# Patient Record
Sex: Female | Born: 2005 | Race: White | Hispanic: No | Marital: Single | Smoking: Former smoker
Health system: Southern US, Community
[De-identification: ages and names within clinical notes are randomized; demographics above are authoritative.]

---
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
## 2023-02-16 ENCOUNTER — Ambulatory Visit: Payer: 59 | Admitting: Gastroenterology

## 2023-02-16 ENCOUNTER — Encounter: Payer: Self-pay | Admitting: Gastroenterology

## 2023-02-16 VITALS — BP 98/64 | HR 90 | Ht 63.0 in | Wt 101.0 lb

## 2023-02-16 DIAGNOSIS — K5909 Other constipation: Secondary | ICD-10-CM

## 2023-02-16 DIAGNOSIS — R112 Nausea with vomiting, unspecified: Secondary | ICD-10-CM

## 2023-02-16 DIAGNOSIS — K219 Gastro-esophageal reflux disease without esophagitis: Secondary | ICD-10-CM | POA: Diagnosis not present

## 2023-02-16 DIAGNOSIS — K5900 Constipation, unspecified: Secondary | ICD-10-CM

## 2023-02-16 MED ORDER — OMEPRAZOLE 20 MG PO CPDR: 20.0000 mg | DELAYED_RELEASE_CAPSULE | Freq: Every day | ORAL | 3 refills | Status: AC

## 2023-02-16 NOTE — Patient Instructions (Addendum)
 _______________________________________________________  If your blood pressure at your visit was 140/90 or greater, please contact your primary care physician to follow up on this.  If you are age 20 or younger, your body mass index should be between 19-25. Your Body mass index is 17.89 kg/m. If this is out of the aformentioned range listed, please consider follow up with your Primary Care Provider.  ________________________________________________________  The [facility] providers would like to encourage you to use MYCHART to communicate with providers for non-urgent requests or questions.  Due to long hold times on the telephone, sending your provider a message by [facility] may be a faster and more efficient way to get a response.  Please allow 48 business hours for a response.  Please remember that this is for non-urgent requests.  _______________________________________________________  We have sent the following medications to your pharmacy for you to pick up at your convenience:  START: omeprazole  20mg  one capsule daily before breakfast  Please purchase the following medications over the counter and take as directed:  Start taking Miralax 1 capful (17 grams) 1x / day for 1 week.   If this is not effective, increase to 1 dose 2x / day for 1 week.   If this is still not effective, increase to two capfuls (34 grams) 2x / day.   Can adjust dose as needed based on response. Can take 1/2 cap daily, skip days, or increase per day.    Thank you for entrusting me with your care and choosing [facility].  [employer]

## 2023-02-16 NOTE — Progress Notes (Signed)
 Chief Complaint: Constipation, nausea, vomiting Primary GI MD: Sampson  HPI: Discussed the use of AI scribe software for clinical note transcription with the patient, who gave verbal consent to proceed.  History of Present Illness   Monica Weber is a 18 year old female with chronic constipation and GERD who presents with nausea and vomiting. She is accompanied by her mother.  For the past few months, she has experienced unpredictable episodes of nausea and vomiting, particularly severe after meals, leading to an inability to eat without feeling nauseous and often resulting in vomiting. Approximately two weeks ago, she had another significant episode. During these episodes, she feels an urge to have a bowel movement but is unable to do so, which is followed by nausea and vomiting.  She was unsure if her production of nausea and vomiting was due to her anxiety about having to vomit.  Burping sometimes accompanies these symptoms. No blood in vomit.  She has a history of chronic constipation, with bowel movements occurring three to four times a week during good weeks. She experiences lower abdominal cramping that improves after a bowel movement. Her mother confirms a lifelong tendency towards constipation. She recalls using Miralax as a child to manage her constipation.  She has been using Aciphex, initially prescribed for her mother's GERD, to manage her symptoms of nausea, vomiting, and burping. She reports improvement with this medication but is unsure if it was due to a placebo effect. She has since discontinued its use.  She acknowledges not drinking enough water, which may contribute to her symptoms. Her family history includes her mother having IBS-D and GERD. She also mentions that her husband experiences infrequent bowel movements, sometimes going eight days without one.   Patient is not a fan of blood draws and recently had blood via PCP at [facility] within the last  3 weeks.  She says need the results on her phone which show CBC and CMP within normal limits.   History reviewed. No pertinent past medical history.  History reviewed. No pertinent surgical history.  Current Outpatient Medications  Medication Sig Dispense Refill   HAILEY 24 FE 1-20 MG-MCG(24) tablet Take 1 tablet by mouth daily.     omeprazole  (PRILOSEC) 20 MG capsule Take 1 capsule (20 mg total) by mouth daily. 30 capsule 3   No current facility-administered medications for this visit.    Allergies as of 02/16/2023 - Review Complete 02/16/2023  Allergen Reaction Noted   Peanut (diagnostic)  02/16/2023    Family History  Problem Relation Age of Onset   Irritable bowel syndrome Mother    GER disease Mother    Epilepsy Sister    Autism Sister    Hypothyroidism Sister    Macular degeneration Maternal Grandmother    Diabetes Maternal Grandmother    Liver disease Maternal Grandmother    Vasculitis Maternal Grandmother    Hypertension Maternal Grandmother    Hyperlipidemia Maternal Grandmother    Colon cancer Neg Hx    Gastric cancer Neg Hx    Pancreatic cancer Neg Hx    Renal cancer Neg Hx     Social History   Socioeconomic History   Marital status: Single    Spouse name: Not on file   Number of children: Not on file   Years of education: Not on file   Highest education level: Not on file  Occupational History   Occupation: barista  Tobacco Use   Smoking status: Former    Types: E-cigarettes  Smokeless tobacco: Never  Vaping Use   Vaping status: Former  Substance and Sexual Activity   Alcohol use: Never   Drug use: Never   Sexual activity: Yes  Other Topics Concern   Not on file  Social History Narrative   Not on file   Social Drivers of Health   Financial Resource Strain: Not on file  Food Insecurity: Not on file  Transportation Needs: Not on file  Physical Activity: Not on file  Stress: Not on file  Social Connections: Not on file  Intimate Partner  Violence: Not on file    Review of Systems:    Constitutional: No weight loss, fever, chills, weakness or fatigue HEENT: Eyes: No change in vision               Ears, Nose, Throat:  No change in hearing or congestion Skin: No rash or itching Cardiovascular: No chest pain, chest pressure or palpitations   Respiratory: No SOB or cough Gastrointestinal: See HPI and otherwise negative Genitourinary: No dysuria or change in urinary frequency Neurological: No headache, dizziness or syncope Musculoskeletal: No new muscle or joint pain Hematologic: No bleeding or bruising Psychiatric: No history of depression or anxiety    Physical Exam:  Vital signs: BP 98/64   Pulse 90   Ht 5' 3 (1.6 m)   Wt 101 lb (45.8 kg)   LMP 02/08/2023   BMI 17.89 kg/m   Constitutional: NAD, Well developed, Well nourished, alert and cooperative Head:  Normocephalic and atraumatic. Eyes:   PEERL, EOMI. No icterus. Conjunctiva pink. Respiratory: Respirations even and unlabored. Lungs clear to auscultation bilaterally.   No wheezes, crackles, or rhonchi.  Cardiovascular:  Regular rate and rhythm. No peripheral edema, cyanosis or pallor.  Gastrointestinal:  Soft, nondistended, nontender. No rebound or guarding.  Hypoactive bowel sounds. No appreciable masses or hepatomegaly. Rectal:  Not performed.  Msk:  Symmetrical without gross deformities. Without edema, no deformity or joint abnormality.  Neurologic:  Alert and  oriented x4;  grossly normal neurologically.  Skin:   Dry and intact without significant lesions or rashes. Psychiatric: Oriented to person, place and time. Demonstrates good judgement and reason without abnormal affect or behaviors.   RELEVANT LABS AND IMAGING: CBC    Component Value Date/Time   WBC 5.5 06/23/2020 0759   RBC 4.38 06/23/2020 0759   HGB 13.3 06/23/2020 0759   HCT 41.0 06/23/2020 0759   PLT 187 06/23/2020 0759   MCV 93.6 06/23/2020 0759   MCH 30.4 06/23/2020 0759   MCHC  32.4 06/23/2020 0759   RDW 12.5 06/23/2020 0759   LYMPHSABS 1,788 06/23/2020 0759   EOSABS 451 06/23/2020 0759   BASOSABS 28 06/23/2020 0759    CMP  No results found for: NA, K, CL, CO2, GLUCOSE, BUN, CREATININE, CALCIUM, PROT, ALBUMIN, AST, ALT, ALKPHOS, BILITOT, GFRNONAA, GFRAA   Assessment/Plan:      Chronic Constipation Longstanding history of infrequent bowel movements (3-4 per week), associated with lower abdominal cramping that improves post-defecation.   --Start daily Miralax, with dose adjustment as needed for symptom control. --Increase water intake to at least 64 ounces per day.  Gastroesophageal Reflux Disease (GERD) Nausea/vomiting History of nausea, vomiting, and burping, which improved with Aciphex. Recent recurrence of symptoms.  Suspect her nausea and vomiting may be factorial and related to constipation versus dyspepsia versus anxiety.  Though there was improvement on Aciphex which is reassuring. -- Start Omeprazole  20mg  daily for 60 days. - Follow-up with me in 8 weeks -  Educated patient on lifestyle modifications and provided patient education handouts  Follow-up in 8 weeks to assess response to treatment. If symptoms persist, consider further diagnostic imaging and labs.      Nestor Blower, PA-C [facility] 02/16/2023, 2:30 PM  Cc: No ref. provider found

## 2023-02-17 NOTE — Progress Notes (Signed)
 I agree with the assessment and plan as outlined by Ms. McMichael.

## 2023-04-13 ENCOUNTER — Encounter: Payer: Self-pay | Admitting: Physician Assistant

## 2023-04-13 ENCOUNTER — Ambulatory Visit: Payer: 59 | Admitting: Physician Assistant

## 2023-04-13 VITALS — BP 98/64 | HR 84 | Ht 63.0 in | Wt 100.8 lb

## 2023-04-13 DIAGNOSIS — R112 Nausea with vomiting, unspecified: Secondary | ICD-10-CM | POA: Diagnosis not present

## 2023-04-13 DIAGNOSIS — K5909 Other constipation: Secondary | ICD-10-CM

## 2023-04-13 DIAGNOSIS — N920 Excessive and frequent menstruation with regular cycle: Secondary | ICD-10-CM

## 2023-04-13 DIAGNOSIS — K5900 Constipation, unspecified: Secondary | ICD-10-CM

## 2023-04-13 DIAGNOSIS — K219 Gastro-esophageal reflux disease without esophagitis: Secondary | ICD-10-CM | POA: Diagnosis not present

## 2023-04-13 DIAGNOSIS — N946 Dysmenorrhea, unspecified: Secondary | ICD-10-CM | POA: Diagnosis not present

## 2023-04-13 NOTE — Patient Instructions (Addendum)
 Follow up as needed.  Please take your proton pump inhibitor medication, omeprazole for one more and then can do as needed or pepcid as needed Avoid spicy and acidic foods Avoid fatty foods Limit your intake of coffee, tea, alcohol, and carbonated drinks Work to maintain a healthy weight Keep the head of the bed elevated at least 3 inches with blocks or a wedge pillow if you are having any nighttime symptoms Stay upright for 2 hours after eating Avoid meals and snacks three to four hours before bedtime  Miralax is an osmotic laxative.  It only brings more water into the stool.  This is safe to take daily.  Can take up to 17 gram of miralax twice a day.  Mix with juice or coffee.  Start 1 capful at night for 3-4 days and reassess your response in 3-4 days.  You can increase and decrease the dose based on your response.  Remember, it can take up to 3-4 days to take effect OR for the effects to wear off.   I often pair this with benefiber in the morning to help assure the stool is not too loose.   Toileting tips to help with your constipation - Drink at least 64-80 ounces of water/liquid per day. - Establish a time to try to move your bowels every day.  For many people, this is after a cup of coffee or after a meal such as breakfast. - Sit all of the way back on the toilet keeping your back fairly straight and while sitting up, try to rest the tops of your forearms on your upper thighs.   - Raising your feet with a step stool/squatty potty can be helpful to improve the angle that allows your stool to pass through the rectum. - Relax the rectum feeling it bulge toward the toilet water.  If you feel your rectum raising toward your body, you are contracting rather than relaxing. - Breathe in and slowly exhale. "Belly breath" by expanding your belly towards your belly button. Keep belly expanded as you gently direct pressure down and back to the anus.  A low pitched GRRR sound can assist with  increasing intra-abdominal pressure.  (Can also trying to blow on a pinwheel and make it move, this helps with the same belly breathing) - Repeat 3-4 times. If unsuccessful, contract the pelvic floor to restore normal tone and get off the toilet.  Avoid excessive straining. - To reduce excessive wiping by teaching your anus to normally contract, place hands on outer aspect of knees and resist knee movement outward.  Hold 5-10 second then place hands just inside of knees and resist inward movement of knees.  Hold 5 seconds.  Repeat a few times each way.  Go to the ER if unable to pass gas, severe AB pain, unable to hold down food, any shortness of breath of chest pain.

## 2023-04-13 NOTE — Progress Notes (Signed)
 I agree with the assessment and plan as outlined by Ms. Steffanie Dunn.

## 2023-04-13 NOTE — Progress Notes (Signed)
 04/13/2023 Monica Weber [id number] 10/09/2005  Referring provider: No ref. provider found Primary GI doctor: Dr. Leonides Schanz  ASSESSMENT AND PLAN:   Chronic constipation Started on MiraLAX 02/16/2023 and increasing water Tried every day for a week but did not help has been doing castor oil packing and has had BM once or twice a day, soft to hard No AB pain, no hematochezia, no weight loss Will call if any changes, given information Consider linzess  GERD with nausea and vomiting Trial of omeprazole 20 mg daily for 2 months Has been better with medication trial, has had less nausea, no further vomiting No dysphagia, no melena Ibuprofen PRN, no ETOH, no family history of GB  no abdominal ultrasound Normal labs with PCP, CBC and CMET  Menorrhagia/dysmenorrhea Worsening in the last several months Going to follow up with GYN   Patient Care Team: Alysia Penna, MD as PCP - General (Internal Medicine) Imogene Burn, MD as Consulting Physician (Gastroenterology)  HISTORY OF PRESENT ILLNESS: 18 y.o. female with a past medical history of listed below presents for follow-up for constipation nausea and vomiting. Patient last seen in the office 02/16/2023 by PA McMichael.  Discussed the use of AI scribe software for clinical note transcription with the patient, who gave verbal consent to proceed.  History of Present Illness   Monica Weber is a 18 year old female with chronic constipation and GERD who presents with gastrointestinal symptoms.  She has experienced chronic constipation for an extended period. MiraLAX once daily was previously ineffective, and she was advised to increase the dose if necessary. Recently, she began using castor oil packs on her abdomen nightly, resulting in daily bowel movements, sometimes more than once a day. Stools vary from soft to slightly hard, depending on hydration, but are not difficult to pass. No abdominal discomfort is noted except during  menstrual periods, which have become more painful and heavier recently.  She has a history of gastroesophageal reflux disease (GERD) with associated nausea and occasional vomiting. Omeprazole 20 mg once daily has alleviated her GERD symptoms, though she sometimes forgets to take it, leading to increased burping and nausea during meals. Since starting the medication, she has not experienced vomiting. No dysphagia is reported.  She experiences anxiety, particularly in academic settings, which she believes may have contributed to previous episodes of vomiting. She is not currently on any specific medication for anxiety.  She reports increased menstrual discomfort and heavier periods recently. No abdominal pain is noted outside of her menstrual periods.  No family history of colon cancer, gallbladder issues, or thyroid problems is reported.  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 She does not consume alcohol and occasionally takes ibuprofen as needed.      She  reports that she has quit smoking. Her smoking use included e-cigarettes. She has never used smokeless tobacco. She reports that she does not drink alcohol and does not use drugs.  RELEVANT GI HISTORY, IMAGING AND LABS: Results   LABS CBC: within normal limits (02/2023) CMP: within normal limits (02/2023)      CBC    Component Value Date/Time   WBC 5.5 06/23/2020 0759   RBC 4.38 06/23/2020 0759   HGB 13.3 06/23/2020 0759   HCT 41.0 06/23/2020 0759   PLT 187 06/23/2020 0759   MCV 93.6 06/23/2020 0759   MCH 30.4 06/23/2020 0759   MCHC 32.4 06/23/2020 0759   RDW 12.5 06/23/2020  0759   LYMPHSABS 1,788 06/23/2020 0759   EOSABS 451 06/23/2020 0759   BASOSABS 28 06/23/2020 0759    Current Medications:   Current Outpatient Medications (Endocrine & Metabolic):    HAILEY 24 FE 1-20 MG-MCG(24) tablet, Take 1 tablet by mouth daily.      Current  Outpatient Medications (Other):    omeprazole (PRILOSEC) 20 MG capsule, Take 1 capsule (20 mg total) by mouth daily.  Medical History: History reviewed. No pertinent past medical history. Allergies:  Allergies  Allergen Reactions   Peanut (Diagnostic)      Surgical History:  She  has no past surgical history on file. Family History:  Her family history includes Autism in her sister; Diabetes in her maternal grandmother; Epilepsy in her sister; GER disease in her mother; Hyperlipidemia in her maternal grandmother; Hypertension in her maternal grandmother; Hypothyroidism in her sister; Irritable bowel syndrome in her mother; Liver disease in her maternal grandmother; Macular degeneration in her maternal grandmother; Vasculitis in her maternal grandmother.  REVIEW OF SYSTEMS  : All other systems reviewed and negative except where noted in the History of Present Illness.  PHYSICAL EXAM: BP 98/64 (Cuff Size: Normal)   Pulse 84   Ht 5\' 3"  (1.6 m)   Wt 100 lb 12.8 oz (45.7 kg)   LMP 04/05/2023 (Exact Date)   BMI 17.86 kg/m  Physical Exam   GENERAL APPEARANCE: Well nourished, in no apparent distress. HEENT: No cervical lymphadenopathy, unremarkable thyroid, sclerae anicteric, conjunctiva pink. RESPIRATORY: Respiratory effort normal, breath sounds equal bilaterally without rales, rhonchi, or wheezing. CARDIO: Regular rate and rhythm with no murmurs, rubs, or gallops, peripheral pulses intact. ABDOMEN: Soft, non-distended, active bowel sounds in all four quadrants, no tenderness to palpation, no rebound, no mass appreciated, abdomen normal, no pain or tenderness. RECTAL: Declines. MUSCULOSKELETAL: Full range of motion, normal gait, without edema. SKIN: Dry, intact without rashes or lesions. No jaundice. NEURO: Alert, oriented, no focal deficits. PSYCH: Cooperative, normal mood and affect.      Doree Albee, PA-C 3:10 PM
